# Patient Record
Sex: Male | Born: 1970 | Race: Black or African American | Marital: Married | Smoking: Never smoker
Health system: Southern US, Community
[De-identification: ages and names within clinical notes are randomized; demographics above are authoritative.]

---

## 2016-09-12 ENCOUNTER — Other Ambulatory Visit: Payer: Self-pay | Admitting: Infectious Disease

## 2016-09-12 ENCOUNTER — Ambulatory Visit
Admission: RE | Admit: 2016-09-12 | Discharge: 2016-09-12 | Disposition: A | Discharge status: Home / Self Care | Payer: No Typology Code available for payment source | Source: Ambulatory Visit | Attending: Infectious Disease | Admitting: Infectious Disease

## 2016-09-12 DIAGNOSIS — Z111 Encounter for screening for respiratory tuberculosis: Secondary | ICD-10-CM

## 2016-10-02 ENCOUNTER — Encounter (HOSPITAL_COMMUNITY): Payer: Self-pay | Admitting: Emergency Medicine

## 2016-10-02 ENCOUNTER — Ambulatory Visit (HOSPITAL_COMMUNITY)
Admission: EM | Admit: 2016-10-02 | Discharge: 2016-10-02 | Disposition: A | Discharge status: Home / Self Care | Payer: Medicaid Other | Attending: Internal Medicine | Admitting: Internal Medicine

## 2016-10-02 ENCOUNTER — Ambulatory Visit (INDEPENDENT_AMBULATORY_CARE_PROVIDER_SITE_OTHER): Payer: Medicaid Other

## 2016-10-02 DIAGNOSIS — R1084 Generalized abdominal pain: Secondary | ICD-10-CM

## 2016-10-02 DIAGNOSIS — K5900 Constipation, unspecified: Secondary | ICD-10-CM

## 2016-10-02 LAB — POCT I-STAT, CHEM 8
BUN: 16 mg/dL (ref 6–20)
CALCIUM ION: 1.26 mmol/L (ref 1.15–1.40)
CHLORIDE: 101 mmol/L (ref 101–111)
CREATININE: 1 mg/dL (ref 0.61–1.24)
Glucose, Bld: 116 mg/dL — ABNORMAL HIGH (ref 65–99)
HCT: 47 % (ref 39.0–52.0)
Hemoglobin: 16 g/dL (ref 13.0–17.0)
Potassium: 4.6 mmol/L (ref 3.5–5.1)
Sodium: 140 mmol/L (ref 135–145)
TCO2: 32 mmol/L (ref 0–100)

## 2016-10-02 MED ORDER — POLYETHYLENE GLYCOL 3350 17 G PO PACK: 17.0000 g | PACK | Freq: Every day | ORAL | 0 refills | Status: AC

## 2016-10-02 NOTE — ED Provider Notes (Signed)
CSN: [id number]     Arrival date & time 10/02/16  1232 History   First MD Initiated Contact with Patient 10/02/16 1305     Chief Complaint  Patient presents with  . Abdominal Pain   (Consider location/radiation/quality/duration/timing/severity/associated sxs/prior Treatment)  Pt accompanied by brother to translate as pt speaks Swhili/French.  Declined professional interpreter.   HPI Jonathan Landry is a 46 y.o. male presenting to UC with c/o generalized abdominal pain that started 4 days ago.  Pain is aching, mild to moderate. Associated constipation and nausea.  Pain occasionally worse with certain movements. He has not tried anything for his symptoms. Denies vomiting or diarrhea. Denies fever or chills.  No hx of abdominal surgeries.    History reviewed. No pertinent past medical history. History reviewed. No pertinent surgical history. History reviewed. No pertinent family history. Social History  Substance Use Topics  . Smoking status: Never Smoker  . Smokeless tobacco: Never Used  . Alcohol use No    Review of Systems  Constitutional: Negative for chills and fever.  HENT: Negative for congestion and sore throat.   Respiratory: Negative for cough and shortness of breath.   Cardiovascular: Negative for chest pain and palpitations.  Gastrointestinal: Positive for abdominal pain, constipation and nausea. Negative for diarrhea and vomiting.  Genitourinary: Negative for dysuria, frequency and hematuria.  Musculoskeletal: Negative for arthralgias, back pain and myalgias.  Skin: Negative for rash.    Allergies  Patient has no known allergies.  Home Medications   Prior to Admission medications   Medication Sig Start Date End Date Taking? Authorizing Provider  polyethylene glycol (MIRALAX / GLYCOLAX) packet Take 17 g by mouth daily. 10/02/16   Junius FinnerErin O'Malley, PA-C   Meds Ordered and Administered this Visit  Medications - No data to display  BP 139/94 (BP Location: Right Arm)    Pulse 69   Temp 97.9 F (36.6 C) (Oral)   Resp 18   SpO2 100%  No data found.   Physical Exam  Constitutional: He is oriented to person, place, and time. He appears well-developed and well-nourished. No distress.  Pt sitting on exam bed, appears well, NAD  HENT:  Head: Normocephalic and atraumatic.  Mouth/Throat: Oropharynx is clear and moist.  Eyes: EOM are normal.  Neck: Normal range of motion. Neck supple.  Cardiovascular: Normal rate and regular rhythm.   Pulmonary/Chest: Effort normal and breath sounds normal. No respiratory distress. He has no wheezes. He has no rales.  Abdominal: Soft. He exhibits no distension and no mass. There is no tenderness. There is no rebound and no guarding.  Musculoskeletal: Normal range of motion.  Neurological: He is alert and oriented to person, place, and time.  Skin: Skin is warm and dry. He is not diaphoretic.  Psychiatric: He has a normal mood and affect. His behavior is normal.  Nursing note and vitals reviewed.   Urgent Care Course     Procedures (including critical care time)  Labs Review Labs Reviewed  POCT I-STAT, CHEM 8 - Abnormal; Notable for the following:       Result Value   Glucose, Bld 116 (*)    All other components within normal limits    Imaging Review Dg Abdomen 1 View  Result Date: 10/02/2016 CLINICAL DATA:  46 y/o  M; diffuse abdominal pain and constipation. EXAM: ABDOMEN - 1 VIEW COMPARISON:  None. FINDINGS: The bowel gas pattern is normal. No radio-opaque calculi or other significant radiographic abnormality are seen. IMPRESSION: Negative. Electronically Signed  By: Mitzi Hansen M.D.   On: 10/02/2016 13:34    MDM   1. Generalized abdominal pain   2. Constipation, unspecified constipation type    Pt c/o generalized abdominal pain and constipation.   Abdominal exam- benign  KUB: no evidence of blockage   Pain likely due to mild constipation. No signs of acute abdomen. About appendicitis, SBO,  or mesenteric ischemia.   Rx: miralax F/u with PCP in 1 week if not improving.  Discussed symptoms that warrant emergent care in the ED.    Junius Finner, PA-C 10/02/16 1349

## 2016-10-02 NOTE — ED Triage Notes (Signed)
Per brother who speaks Swahili/French  Pt here for abd pain onset 4 days associated w/constipation, nauseas  Denies urinary sx, fevers  [place]  A&O x4... NAD

## 2016-11-27 ENCOUNTER — Ambulatory Visit
Admission: RE | Admit: 2016-11-27 | Discharge: 2016-11-27 | Disposition: A | Discharge status: Home / Self Care | Payer: No Typology Code available for payment source | Source: Ambulatory Visit | Attending: Internal Medicine | Admitting: Internal Medicine

## 2016-11-27 ENCOUNTER — Other Ambulatory Visit: Payer: Self-pay | Admitting: Internal Medicine

## 2016-11-27 DIAGNOSIS — Z111 Encounter for screening for respiratory tuberculosis: Secondary | ICD-10-CM

## 2017-02-16 ENCOUNTER — Encounter (HOSPITAL_COMMUNITY): Payer: Self-pay | Admitting: *Deleted

## 2017-02-16 ENCOUNTER — Ambulatory Visit (HOSPITAL_COMMUNITY)
Admission: EM | Admit: 2017-02-16 | Discharge: 2017-02-16 | Disposition: A | Discharge status: Home / Self Care | Payer: Medicaid Other | Attending: Family Medicine | Admitting: Family Medicine

## 2017-02-16 DIAGNOSIS — G44209 Tension-type headache, unspecified, not intractable: Secondary | ICD-10-CM

## 2017-02-16 MED ORDER — BUTALBITAL-APAP-CAFFEINE 50-325-40 MG PO TABS: 1.0000 | ORAL_TABLET | Freq: Four times a day (QID) | ORAL | 0 refills | Status: AC | PRN

## 2017-02-16 NOTE — ED Triage Notes (Signed)
Pt  Reports   Pain  In  Top  Of  Head   For   2  Months             [employer]  Utilized

## 2017-02-16 NOTE — ED Provider Notes (Signed)
CSN: [id number]     Arrival date & time 02/16/17  1503 History   None    Chief Complaint  Patient presents with  . Headache   (Consider location/radiation/quality/duration/timing/severity/associated sxs/prior Treatment) Patient c/o headache at top of head and around scalp.  He has this on and off for last 2 months.   The history is provided by the patient.  Headache  Pain location:  Generalized Quality:  Dull Radiates to:  Does not radiate Severity currently:  5/10 Severity at highest:  5/10 Duration:  8 weeks Timing:  Intermittent Relieved by:  None tried   History reviewed. No pertinent past medical history. History reviewed. No pertinent surgical history. History reviewed. No pertinent family history. Social History  Substance Use Topics  . Smoking status: Never Smoker  . Smokeless tobacco: Never Used  . Alcohol use No    Review of Systems  Constitutional: Negative.   HENT: Negative.   Eyes: Negative.   Respiratory: Negative.   Cardiovascular: Negative.   Gastrointestinal: Negative.   Endocrine: Negative.   Genitourinary: Negative.   Musculoskeletal: Negative.   Allergic/Immunologic: Negative.   Neurological: Positive for headaches.  Hematological: Negative.   Psychiatric/Behavioral: Negative.     Allergies  Patient has no known allergies.  Home Medications   Prior to Admission medications   Medication Sig Start Date End Date Taking? Authorizing Provider  butalbital-acetaminophen-caffeine (FIORICET, ESGIC) [phone] MG tablet Take 1-2 tablets by mouth every 6 (six) hours as needed for headache. 02/16/17 02/16/18  Deatra Canterxford, Camielle Sizer J, FNP  polyethylene glycol (MIRALAX / Ethelene HalGLYCOLAX) packet Take 17 g by mouth daily. 10/02/16   Lurene ShadowPhelps, Erin O, PA-C   Meds Ordered and Administered this Visit  Medications - No data to display  BP (!) 135/92 (BP Location: Right Arm)   Pulse 82   Temp 98.6 F (37 C) (Oral)   Resp 18  No data found.   Physical Exam   Constitutional: He is oriented to person, place, and time. He appears well-developed and well-nourished.  HENT:  Head: Normocephalic and atraumatic.  Eyes: Pupils are equal, round, and reactive to light. Conjunctivae and EOM are normal.  Neck: Normal range of motion. Neck supple.  Cardiovascular: Normal rate, regular rhythm and normal heart sounds.   Pulmonary/Chest: Effort normal and breath sounds normal.  Neurological: He is alert and oriented to person, place, and time.  Nursing note and vitals reviewed.   Urgent Care Course     Procedures (including critical care time)  Labs Review Labs Reviewed - No data to display  Imaging Review No results found.   Visual Acuity Review  Right Eye Distance:   Left Eye Distance:   Bilateral Distance:    Right Eye Near:   Left Eye Near:    Bilateral Near:         MDM   1. Tension headache    Fioricet one to two po qid prn #20  Work note for today  Follow up with PCP      Deatra Canterxford, Teren Franckowiak J, FNP 02/16/17 1558

## 2017-03-06 ENCOUNTER — Ambulatory Visit (HOSPITAL_COMMUNITY)
Admission: EM | Admit: 2017-03-06 | Discharge: 2017-03-06 | Disposition: A | Discharge status: Home / Self Care | Payer: Medicaid Other

## 2017-03-06 ENCOUNTER — Encounter (HOSPITAL_COMMUNITY): Payer: Self-pay

## 2017-03-06 DIAGNOSIS — M5442 Lumbago with sciatica, left side: Secondary | ICD-10-CM

## 2017-03-06 DIAGNOSIS — M5441 Lumbago with sciatica, right side: Secondary | ICD-10-CM | POA: Diagnosis not present

## 2017-03-06 DIAGNOSIS — M545 Low back pain: Secondary | ICD-10-CM

## 2017-03-06 DIAGNOSIS — G8929 Other chronic pain: Secondary | ICD-10-CM

## 2017-03-06 MED ORDER — CYCLOBENZAPRINE HCL 10 MG PO TABS: 10.0000 mg | ORAL_TABLET | Freq: Two times a day (BID) | ORAL | 0 refills | Status: DC | PRN

## 2017-03-06 MED ORDER — DICLOFENAC SODIUM 75 MG PO TBEC: 75.0000 mg | DELAYED_RELEASE_TABLET | Freq: Two times a day (BID) | ORAL | 0 refills | Status: DC

## 2017-03-06 MED ORDER — KETOROLAC TROMETHAMINE 30 MG/ML IJ SOLN
30.0000 mg | Freq: Once | INTRAMUSCULAR | Status: AC
  Administered 2017-03-06: 30 mg via INTRAMUSCULAR

## 2017-03-06 MED ORDER — DEXAMETHASONE SODIUM PHOSPHATE 10 MG/ML IJ SOLN
10.0000 mg | Freq: Once | INTRAMUSCULAR | Status: AC
  Administered 2017-03-06: 10 mg via INTRAMUSCULAR

## 2017-03-06 MED ORDER — DEXAMETHASONE SODIUM PHOSPHATE 10 MG/ML IJ SOLN
INTRAMUSCULAR | Status: AC
Start: 2017-03-06 — End: 2017-03-06
  Filled 2017-03-06: qty 1

## 2017-03-06 MED ORDER — KETOROLAC TROMETHAMINE 30 MG/ML IJ SOLN
INTRAMUSCULAR | Status: AC
  Filled 2017-03-06: qty 1

## 2017-03-06 NOTE — ED Triage Notes (Signed)
Pt here for lumbar back pain, did have this issue [place] time [place] in [place]. No numbness or tingling or radiating. Hurts to move and decreased ROM. Hasn't taken any otc medication.

## 2017-03-06 NOTE — ED Provider Notes (Signed)
History of Present Illness   Patient Identification Jonathan Landry is a 46 y.o. male.  Patient information was obtained from relative(s). History/Exam limitations: communication barrier Language.   Chief Complaint  Back Pain   Patient presents with complaint of back pain. This is a result of no known injury. Onset of pain was 2 days ago and has been unchanged since. The pain is located in mid lower back, described as aching, dull and throbbing and rated as severe, to both thighs. Symptoms include no other symptoms. The patient also complains of N/A. The patient denies weakness, numbness, incontinence, dysuria, abdominal pain, fever, hx cancer, tingling, morning stiffness, new numbness, new weakness, perianal numbness. The patient denies other injuries. Care prior to arrival consisted of rest, NSAID and heat with minimal relief.  History reviewed. No pertinent past medical history. No family history on file. No current facility-administered medications for this encounter.    Current Outpatient Prescriptions  Medication Sig Dispense Refill  . amLODipine (NORVASC) 5 MG tablet Take 5 mg by mouth daily.    . butalbital-acetaminophen-caffeine (FIORICET, ESGIC) 50-325-40 MG tablet Take 1-2 tablets by mouth every 6 (six) hours as needed for headache. 20 tablet 0  . cyclobenzaprine (FLEXERIL) 10 MG tablet Take 1 tablet (10 mg total) by mouth 2 (two) times daily as needed for muscle spasms. 20 tablet 0  . diclofenac (VOLTAREN) 75 MG EC tablet Take 1 tablet (75 mg total) by mouth 2 (two) times daily. 20 tablet 0  . polyethylene glycol (MIRALAX / GLYCOLAX) packet Take 17 g by mouth daily. 14 each 0   No Known Allergies Social History   Social History  . Marital status: Married    Spouse name: N/A  . Number of children: N/A  . Years of education: N/A   Occupational History  . Not on file.   Social History Main Topics  . Smoking status: Never Smoker  . Smokeless tobacco: Never Used  .  Alcohol use No  . Drug use: Unknown  . Sexual activity: Not on file   Other Topics Concern  . Not on file   Social History Narrative  . No narrative on file   Review of Systems Pertinent items noted in HPI and remainder of comprehensive ROS otherwise negative.   Physical Exam   BP 137/81 (BP Location: Left Arm)   Pulse 61   Temp (!) 97.4 F (36.3 C) (Oral)   Resp 18   SpO2 99%  BP 137/81 (BP Location: Left Arm)   Pulse 61   Temp (!) 97.4 F (36.3 C) (Oral)   Resp 18   SpO2 99%  General appearance: alert, cooperative, appears stated age and no distress Neck: negative Back: symmetric, no curvature. ROM normal. No CVA tenderness., positive straight leg raise Lungs: clear to auscultation bilaterally Chest wall: no tenderness Heart: regular rate and rhythm Abdomen: normal findings: bowel sounds normal and soft, non-tender Extremities: extremities normal, atraumatic, no cyanosis or edema Pulses: 2+ and symmetric Skin: Skin color, texture, turgor normal. No rashes or lesions Neurologic: Alert and oriented X 3, normal strength and tone. Normal symmetric reflexes. Normal coordination and gait  ED Course   Studies: None indicated.  Records Reviewed: Old medical records.  Treatments: None.  Consultations: None  Disposition: Home Nonsteroidals, Muscle relaxants and Advised to return for worsening or additional problems such as abdominal or chest pain   Dorena BodoKennard, Jonathan Rex, NP 03/06/17 2134

## 2017-03-06 NOTE — Discharge Instructions (Signed)
You most likely have a strained muscle in your lower back. I have prescribed two medicines for your pain. The first is diclofenac, take 1 tablet twice a day and the other is Flexeril, take 1 tablet twice a day. Flexeril may cause drowsiness so do not drive until you know how this medicine affects you. Also do not drink any alcohol either. You may apply ice and alternate with heat for 15 minutes at a time 4 times daily and for additional pain control you may take tylenol over the counter ever 4 hours but do not take more than 4000 mg a day. Should your pain continue or fail to resolve, follow up with your primary care provider or return to clinic as needed.   I have attached the contact information for [facility], contact them to schedule an appointment to establish for primary care.

## 2017-05-27 ENCOUNTER — Ambulatory Visit (HOSPITAL_COMMUNITY)
Admission: EM | Admit: 2017-05-27 | Discharge: 2017-05-27 | Disposition: A | Discharge status: Home / Self Care | Payer: Medicaid Other | Attending: Family Medicine | Admitting: Family Medicine

## 2017-05-27 ENCOUNTER — Encounter (HOSPITAL_COMMUNITY): Payer: Self-pay | Admitting: Emergency Medicine

## 2017-05-27 DIAGNOSIS — S39012A Strain of muscle, fascia and tendon of lower back, initial encounter: Secondary | ICD-10-CM | POA: Diagnosis not present

## 2017-05-27 DIAGNOSIS — T148XXA Other injury of unspecified body region, initial encounter: Secondary | ICD-10-CM

## 2017-05-27 MED ORDER — IBUPROFEN 800 MG PO TABS: 800.0000 mg | ORAL_TABLET | Freq: Three times a day (TID) | ORAL | 0 refills | Status: AC

## 2017-05-27 MED ORDER — DICLOFENAC SODIUM 75 MG PO TBEC: 75.0000 mg | DELAYED_RELEASE_TABLET | Freq: Two times a day (BID) | ORAL | 0 refills | Status: DC

## 2017-05-27 MED ORDER — CYCLOBENZAPRINE HCL 10 MG PO TABS: 10.0000 mg | ORAL_TABLET | Freq: Two times a day (BID) | ORAL | 0 refills | Status: AC | PRN
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
## 2017-05-27 NOTE — Discharge Instructions (Signed)
Please return if you are not improving within one week.  HOME CARE INSTRUCTIONS: For many people, back pain returns. Since low back pain is rarely dangerous, it is often a condition that people can learn to manage on their own. Please remain active. It is stressful on the back to sit or stand in one place. Do not sit, drive, or stand in one place for more than 30 minutes at a time. Take short walks on level surfaces as soon as pain allows. Try to increase the length of time you walk each day. Do not stay in bed. Resting more than 1 or 2 days can delay your recovery. Do not avoid exercise or work. Your body is made to move. It is not dangerous to be active, even though your back may hurt. Your back will likely heal faster if you return to being active before your pain is gone. Over-the-counter medicines to reduce pain and inflammation are often the most helpful.  SEEK MEDICAL CARE IF: You have pain that is not relieved with rest or medicine. You have pain that does not improve in 1 week. You have new symptoms. You are generally not feeling well.  SEEK IMMEDIATE MEDICAL CARE IF: You have pain that radiates from your back into your legs. You develop new bowel or bladder control problems. You have unusual weakness or numbness in your arms or legs. You develop nausea or vomiting. You develop abdominal pain. You feel faint.
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
## 2017-06-01 IMAGING — CR DG CHEST 1V
1 series · 1 of 1 positions shown · non-contrast
Comparison: 09/12/2016

CLINICAL DATA: Positive PPD.

EXAM:
CHEST 1 VIEW

[w chest pa]
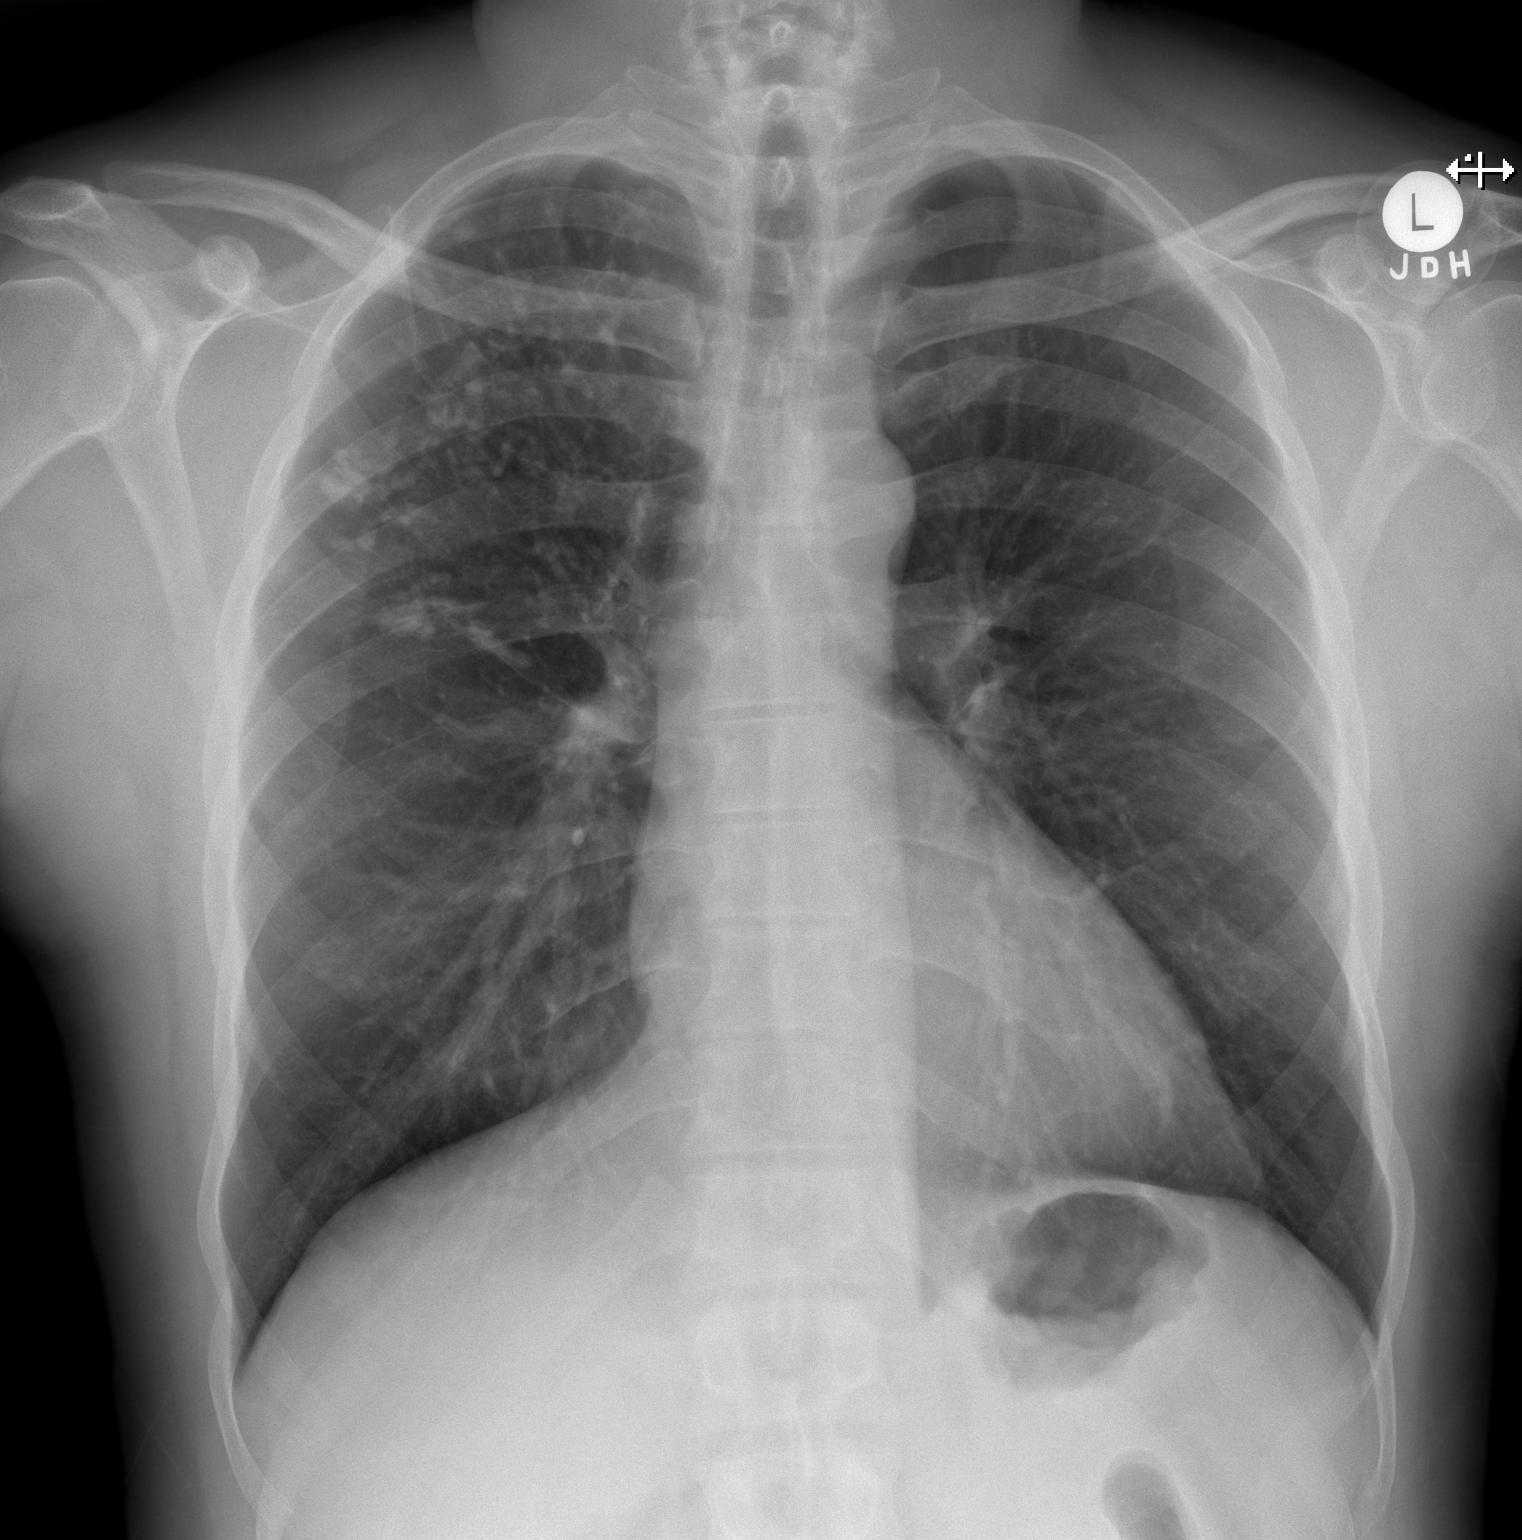

[1 of 1 positions shown; findings below may reference images not displayed]

FINDINGS: Multiple calcified nodular densities in the right upper lobe. These
are stable. Lungs otherwise clear. Heart is normal size. No
effusions or acute bony abnormality.
IMPRESSION: Stable calcified nodular densities in the right upper lobe. This may
be related to pulmonary tuberculosis. No change. No acute findings.
